# Patient Record
Sex: Female | Born: 1985 | Hispanic: No | Smoking: Never smoker
Health system: Southern US, Community
[De-identification: ages and names within clinical notes are randomized; demographics above are authoritative.]

---
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
## 2007-03-07 ENCOUNTER — Emergency Department (HOSPITAL_COMMUNITY): Admission: EM | Admit: 2007-03-07 | Discharge: 2007-03-07 | Payer: Self-pay | Admitting: Emergency Medicine

## 2011-12-13 ENCOUNTER — Other Ambulatory Visit: Payer: Self-pay | Admitting: Family Medicine

## 2011-12-13 ENCOUNTER — Ambulatory Visit
Admission: RE | Admit: 2011-12-13 | Discharge: 2011-12-13 | Disposition: A | Discharge status: Home / Self Care | Payer: BC Managed Care – PPO | Source: Ambulatory Visit | Attending: Family Medicine | Admitting: Family Medicine

## 2011-12-13 DIAGNOSIS — R0789 Other chest pain: Secondary | ICD-10-CM

## 2013-05-14 ENCOUNTER — Other Ambulatory Visit: Payer: Self-pay | Admitting: Family Medicine

## 2013-05-14 ENCOUNTER — Ambulatory Visit
Admission: RE | Admit: 2013-05-14 | Discharge: 2013-05-14 | Disposition: A | Discharge status: Home / Self Care | Payer: BC Managed Care – PPO | Source: Ambulatory Visit | Attending: Family Medicine | Admitting: Family Medicine

## 2013-05-14 DIAGNOSIS — G8929 Other chronic pain: Secondary | ICD-10-CM

## 2013-10-30 IMAGING — CR DG LUMBAR SPINE COMPLETE 4+V
5 series · 5 of 5 positions shown · non-contrast
Comparison: None.

CLINICAL DATA: Low back pain.  Chronic SI joint pain

LUMBAR SPINE - COMPLETE 4+ VIEW

[view not recorded (1 of 5)]
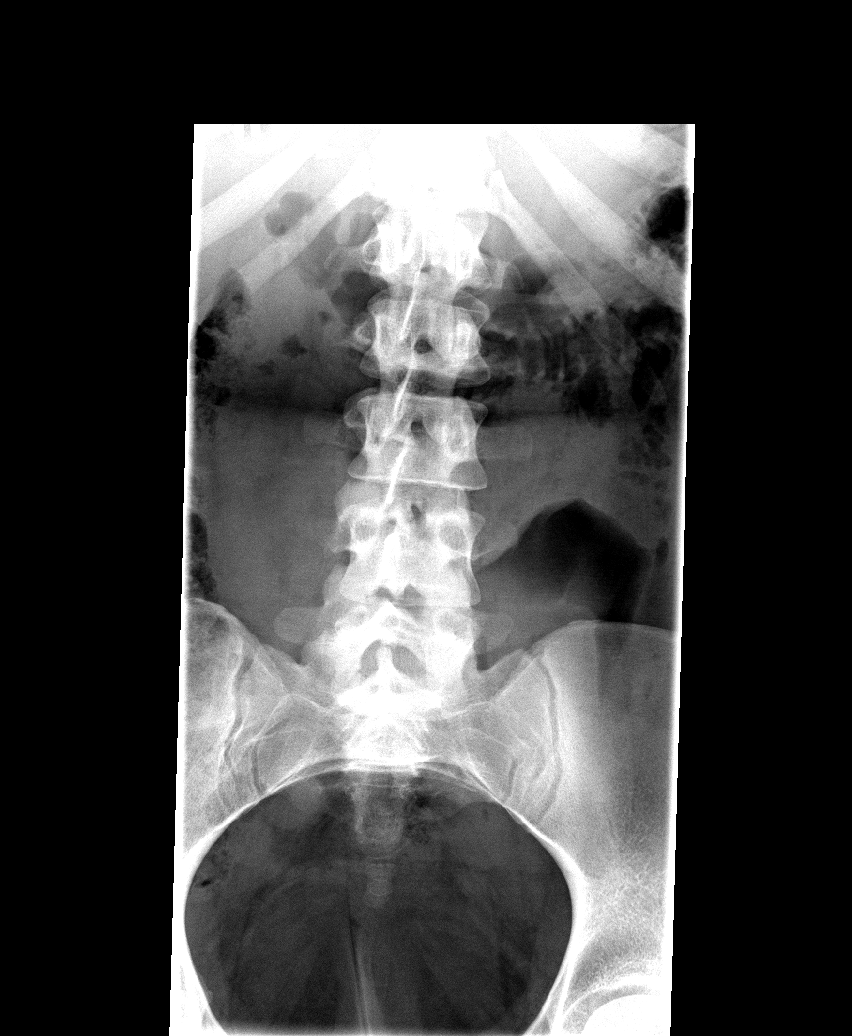

[view not recorded (2 of 5)]
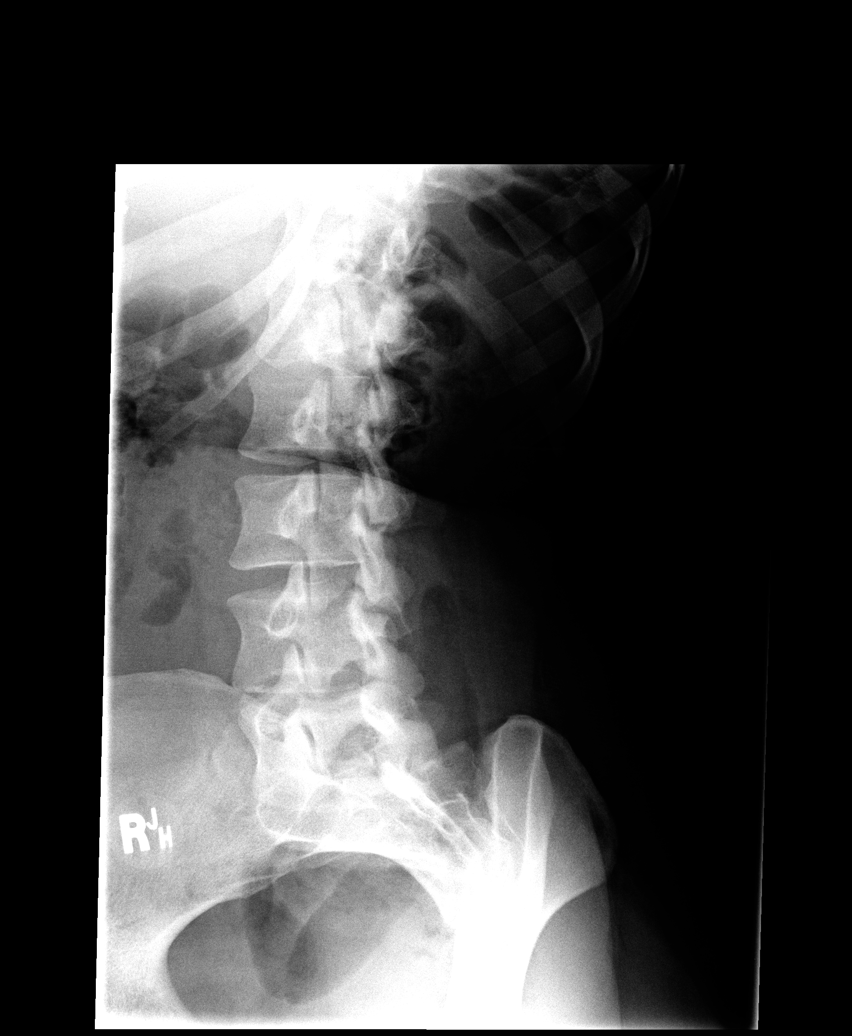

[view not recorded (3 of 5)]
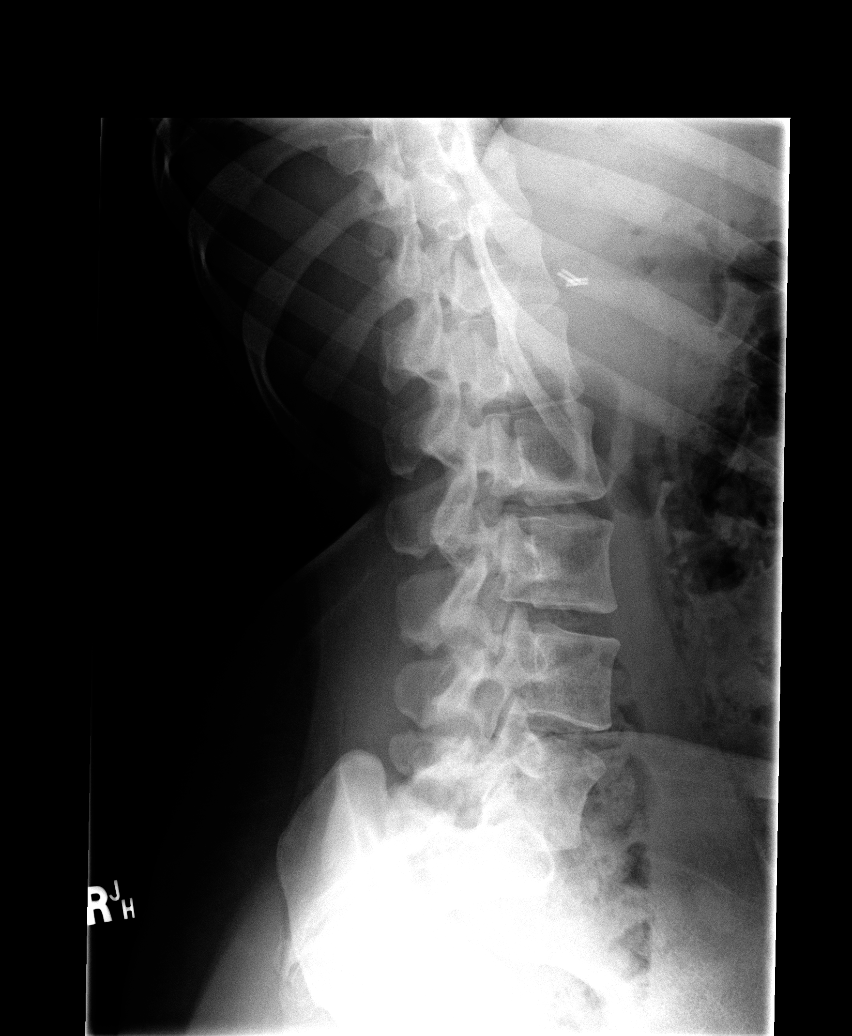

[view not recorded (4 of 5)]
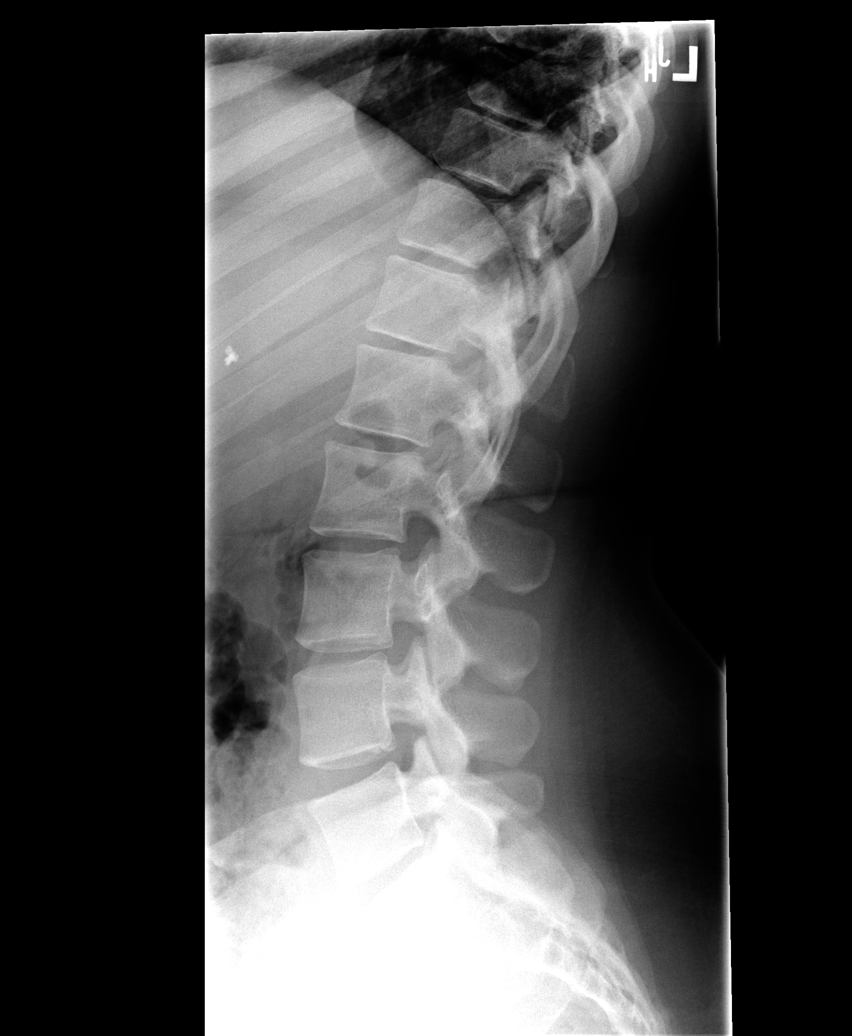

[view not recorded (5 of 5)]
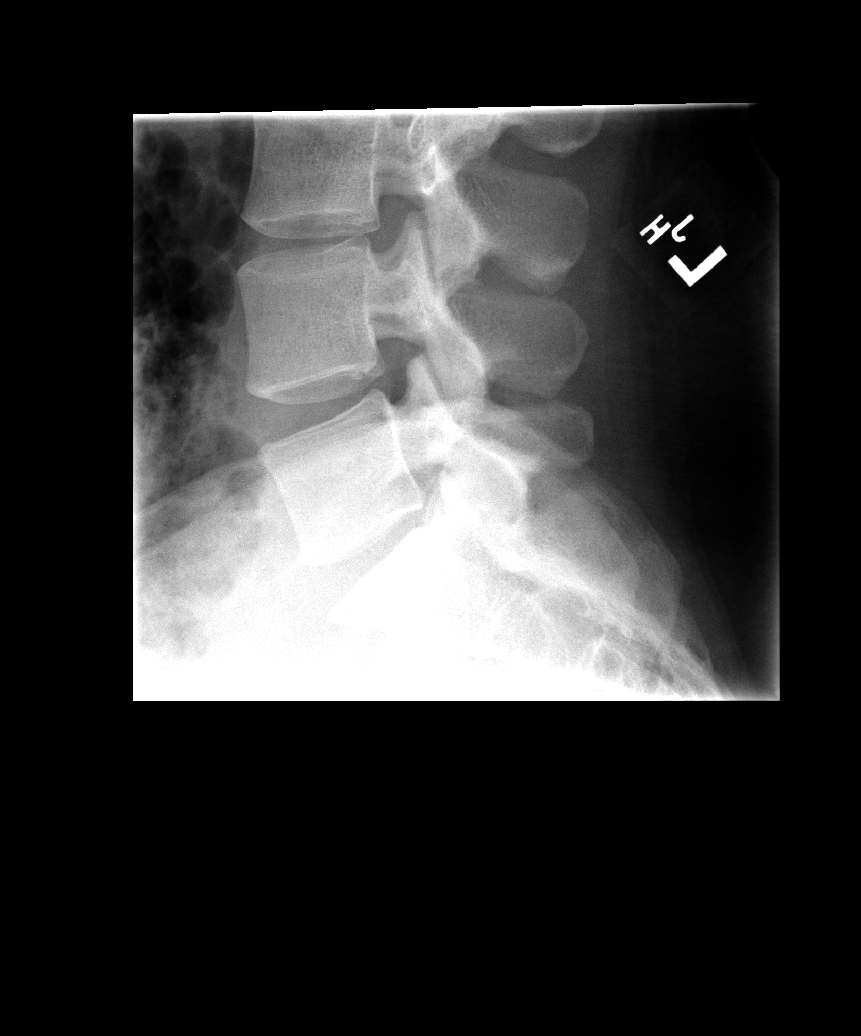

[5 of 5 positions shown; findings below may reference images not displayed]

FINDINGS: There is no evidence of lumbar spine fracture.
Alignment is normal.  Intervertebral disc spaces are maintained. SI
joint is normal bilaterally.
IMPRESSION: Negative.

## 2016-11-07 ENCOUNTER — Encounter (INDEPENDENT_AMBULATORY_CARE_PROVIDER_SITE_OTHER): Payer: Self-pay

## 2016-11-07 ENCOUNTER — Encounter (INDEPENDENT_AMBULATORY_CARE_PROVIDER_SITE_OTHER): Payer: Self-pay | Admitting: Orthopedic Surgery

## 2016-11-07 ENCOUNTER — Ambulatory Visit (INDEPENDENT_AMBULATORY_CARE_PROVIDER_SITE_OTHER): Payer: 59 | Admitting: Orthopedic Surgery

## 2016-11-07 ENCOUNTER — Ambulatory Visit (INDEPENDENT_AMBULATORY_CARE_PROVIDER_SITE_OTHER): Payer: 59

## 2016-11-07 VITALS — Ht 69.0 in | Wt 197.0 lb

## 2016-11-07 DIAGNOSIS — M84374A Stress fracture, right foot, initial encounter for fracture: Secondary | ICD-10-CM | POA: Insufficient documentation

## 2016-11-07 NOTE — Progress Notes (Signed)
   Office Visit Note   Patient: Patricia Donovan           Date of Birth: [birth year]           MRN: [id number] Visit Date: 11/07/2016              Requested by: No referring provider defined for this encounter. PCP: No primary care provider on file.   Assessment & Plan: Visit Diagnoses:  1. Stress fracture of right calcaneus     Plan: Increase activities as tolerated do not push through the pain. Use Aleve 2 by mouth twice a day when necessary for pain.  Follow-Up Instructions: Return if symptoms worsen or fail to improve.   Orders:  Orders Placed This Encounter  Procedures  . XR Foot Complete Right   No orders of the defined types were placed in this encounter.     Procedures: No procedures performed   Clinical Data: No additional findings.   Subjective: Chief Complaint  Patient presents with  . Right Foot - Pain    Patient is here for evaluation of right foot. She just completed the [place] marathon on Sunday. She is having sharp shooting pain in foot. She was diagnosed with ITB syndrome and given antiinflammatories. She has wrapped, iced without relief.   Patient states her pain primarily is in the calcaneus worse after prolonged activities no pain with start up. Review of Systems   Objective: Vital Signs: Ht 5\' 9"  (1.753 m)   Wt 197 lb (89.4 kg)   BMI 29.09 kg/m   Physical Exam on examination patient is alert oriented no adenopathy well-dressed normal affect normal respiratory effort is normal gait. She has good ankle and subtalar motion good pulses. Peroneal and posterior tibial tendon are nontender to palpation the Achilles tendon is nontender to palpation she has no tenderness to palpation origin of plantar fascia. Lateral compression of the calcaneus reproduces her pain.  Ortho Exam  Specialty Comments:  No specialty comments available.  Imaging: Xr Foot Complete Right  Result Date: 11/07/2016 Three-view radiographs of the right foot shows no  signs of fractures or joint spaces are well-maintained no fracture base of fifth metatarsal calcaneal fracture    PMFS History: Patient Active Problem List   Diagnosis Date Noted  . Stress fracture of right calcaneus 11/07/2016   No past medical history on file.  No family history on file.  No past surgical history on file. Social History   Occupational History  . Not on file.   Social History Main Topics  . Smoking status: Never Smoker  . Smokeless tobacco: Never Used  . Alcohol use Not on file  . Drug use: Unknown  . Sexual activity: Not on file
